# Patient Record
Sex: Female | Born: 1960 | Race: White | Hispanic: No | Smoking: Never smoker
Health system: Southern US, Community
[De-identification: ages and names within clinical notes are randomized; demographics above are authoritative.]

## PROBLEM LIST (undated history)

## (undated) DIAGNOSIS — I10 Essential (primary) hypertension: Secondary | ICD-10-CM

## (undated) DIAGNOSIS — Z8679 Personal history of other diseases of the circulatory system: Secondary | ICD-10-CM

## (undated) DIAGNOSIS — Z951 Presence of aortocoronary bypass graft: Secondary | ICD-10-CM

## (undated) DIAGNOSIS — E785 Hyperlipidemia, unspecified: Secondary | ICD-10-CM

## (undated) DIAGNOSIS — F419 Anxiety disorder, unspecified: Secondary | ICD-10-CM

## (undated) DIAGNOSIS — M1990 Unspecified osteoarthritis, unspecified site: Secondary | ICD-10-CM

## (undated) DIAGNOSIS — F32A Depression, unspecified: Secondary | ICD-10-CM

## (undated) DIAGNOSIS — G4730 Sleep apnea, unspecified: Secondary | ICD-10-CM

## (undated) DIAGNOSIS — F329 Major depressive disorder, single episode, unspecified: Secondary | ICD-10-CM

## (undated) DIAGNOSIS — I219 Acute myocardial infarction, unspecified: Secondary | ICD-10-CM

## (undated) DIAGNOSIS — I509 Heart failure, unspecified: Secondary | ICD-10-CM

## (undated) DIAGNOSIS — J45909 Unspecified asthma, uncomplicated: Secondary | ICD-10-CM

## (undated) DIAGNOSIS — K219 Gastro-esophageal reflux disease without esophagitis: Secondary | ICD-10-CM

## (undated) DIAGNOSIS — I2510 Atherosclerotic heart disease of native coronary artery without angina pectoris: Secondary | ICD-10-CM

## (undated) DIAGNOSIS — T7840XA Allergy, unspecified, initial encounter: Secondary | ICD-10-CM

## (undated) DIAGNOSIS — D649 Anemia, unspecified: Secondary | ICD-10-CM

## (undated) HISTORY — DX: Hyperlipidemia, unspecified: E78.5

## (undated) HISTORY — DX: Sleep apnea, unspecified: G47.30

## (undated) HISTORY — DX: Atherosclerotic heart disease of native coronary artery without angina pectoris: I25.10

## (undated) HISTORY — DX: Essential (primary) hypertension: I10

## (undated) HISTORY — DX: Anemia, unspecified: D64.9

## (undated) HISTORY — DX: Unspecified asthma, uncomplicated: J45.909

## (undated) HISTORY — PX: FRACTURE SURGERY: SHX138

## (undated) HISTORY — DX: Allergy, unspecified, initial encounter: T78.40XA

## (undated) HISTORY — DX: Major depressive disorder, single episode, unspecified: F32.9

## (undated) HISTORY — DX: Acute myocardial infarction, unspecified: I21.9

## (undated) HISTORY — DX: Gastro-esophageal reflux disease without esophagitis: K21.9

## (undated) HISTORY — PX: CORONARY ARTERY BYPASS GRAFT: SHX141

## (undated) HISTORY — DX: Unspecified osteoarthritis, unspecified site: M19.90

## (undated) HISTORY — DX: Heart failure, unspecified: I50.9

## (undated) HISTORY — DX: Personal history of other diseases of the circulatory system: Z86.79

## (undated) HISTORY — DX: Depression, unspecified: F32.A

## (undated) HISTORY — DX: Presence of aortocoronary bypass graft: Z95.1

## (undated) HISTORY — DX: Anxiety disorder, unspecified: F41.9

---

## 1998-02-28 ENCOUNTER — Ambulatory Visit (HOSPITAL_COMMUNITY): Admission: RE | Admit: 1998-02-28 | Discharge: 1998-02-28 | Payer: Self-pay | Admitting: Cardiology

## 2002-06-22 ENCOUNTER — Encounter: Payer: Self-pay | Admitting: Emergency Medicine

## 2002-06-22 ENCOUNTER — Emergency Department (HOSPITAL_COMMUNITY): Admission: EM | Admit: 2002-06-22 | Discharge: 2002-06-23 | Payer: Self-pay | Admitting: Emergency Medicine

## 2002-06-23 ENCOUNTER — Encounter: Payer: Self-pay | Admitting: Emergency Medicine

## 2008-04-19 ENCOUNTER — Inpatient Hospital Stay (HOSPITAL_COMMUNITY): Admission: EM | Admit: 2008-04-19 | Discharge: 2008-04-24 | Payer: Self-pay | Admitting: Emergency Medicine

## 2008-05-12 ENCOUNTER — Encounter: Payer: Self-pay | Admitting: Internal Medicine

## 2008-06-07 ENCOUNTER — Encounter: Payer: Self-pay | Admitting: Internal Medicine

## 2008-07-07 ENCOUNTER — Encounter: Payer: Self-pay | Admitting: Internal Medicine

## 2008-08-07 ENCOUNTER — Encounter: Payer: Self-pay | Admitting: Internal Medicine

## 2008-09-06 ENCOUNTER — Encounter: Payer: Self-pay | Admitting: Internal Medicine

## 2009-11-28 ENCOUNTER — Ambulatory Visit: Payer: Self-pay | Admitting: Cardiology

## 2009-11-28 ENCOUNTER — Encounter: Payer: Self-pay | Admitting: Cardiology

## 2009-11-29 ENCOUNTER — Encounter: Payer: Self-pay | Admitting: Cardiology

## 2009-12-01 ENCOUNTER — Encounter: Payer: Self-pay | Admitting: Cardiology

## 2009-12-01 DIAGNOSIS — G4733 Obstructive sleep apnea (adult) (pediatric): Secondary | ICD-10-CM

## 2009-12-12 ENCOUNTER — Encounter: Payer: Self-pay | Admitting: Cardiology

## 2009-12-26 ENCOUNTER — Encounter: Payer: Self-pay | Admitting: Cardiology

## 2009-12-26 DIAGNOSIS — E669 Obesity, unspecified: Secondary | ICD-10-CM

## 2009-12-26 DIAGNOSIS — M329 Systemic lupus erythematosus, unspecified: Secondary | ICD-10-CM

## 2009-12-26 DIAGNOSIS — R072 Precordial pain: Secondary | ICD-10-CM

## 2009-12-26 DIAGNOSIS — I2510 Atherosclerotic heart disease of native coronary artery without angina pectoris: Secondary | ICD-10-CM

## 2009-12-26 DIAGNOSIS — Z951 Presence of aortocoronary bypass graft: Secondary | ICD-10-CM | POA: Insufficient documentation

## 2009-12-26 DIAGNOSIS — Z9861 Coronary angioplasty status: Secondary | ICD-10-CM

## 2009-12-26 DIAGNOSIS — I209 Angina pectoris, unspecified: Secondary | ICD-10-CM

## 2009-12-26 DIAGNOSIS — K219 Gastro-esophageal reflux disease without esophagitis: Secondary | ICD-10-CM | POA: Insufficient documentation

## 2010-11-06 NOTE — Letter (Signed)
Summary: Appointment -missed  [facility] at [facility] [address]   [place], [place] [address]   Phone: [phone]  Fax: [phone]     December 26, 2009 MRN: [id number]      [name] [address] [place], [place]  [address]      Dear Ms. [facility],  Our records indicate you missed your appointment on December 26, 2009                        with Dr. Andee Lineman.   It is very important that we reach you to reschedule this appointment. We look forward to participating in your health care needs.   Please contact us at the number listed above at your earliest convenience to reschedule this appointment.   Sincerely,    Glass blower/designer

## 2010-11-06 NOTE — Letter (Signed)
Summary: [facility] H& P/ D& C DR. Fayrene Fearing [facility]  [facility] D& C DR. Fayrene Fearing [facility]   Imported By: Zachary George 02/05/2010 11:45:57  _____________________________________________________________________  External Attachment:    Type:   Image     Comment:   External Document

## 2010-11-06 NOTE — Letter (Signed)
Summary: [facility] Order for Home Sleep Testing  [facility] Order for Home Sleep Testing   Imported By: Cyril Loosen, RN, BSN 12/01/2009 15:53:09  _____________________________________________________________________  External Attachment:    Type:   Image     Comment:   External Document  Appended Document: Orders Update    Clinical Lists Changes  Problems: Added new problem of OBSTRUCTIVE SLEEP APNEA (ICD-327.23) Orders: Added new Referral order of Home Health Referral [facility]) - Signed

## 2010-11-06 NOTE — Consult Note (Signed)
Summary: CARDIOLOGY CONSULT/ [facility]  CARDIOLOGY CONSULT/ [facility]   Imported By: Zachary George 02/05/2010 11:45:19  _____________________________________________________________________  External Attachment:    Type:   Image     Comment:   External Document
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
## 2011-02-19 NOTE — Op Note (Signed)
NAMEZAELA, GRALEY             ACCOUNT NO.:  [id number]   MEDICAL RECORD NO.:  [id number]          PATIENT TYPE:  INP   LOCATION:  1830                         FACILITY:  [facility]   PHYSICIAN:  Jene Every, M.D.    DATE OF BIRTH:  1960-10-29   DATE OF PROCEDURE:  04/20/2008  DATE OF DISCHARGE:                               OPERATIVE REPORT   PREOPERATIVE DIAGNOSIS:  Fracture-dislocation of the talus with a  pantalar dislocation.   POSTOPERATIVE DIAGNOSIS:  Fracture-dislocation of the talus with a  pantalar dislocation.   PROCEDURE PERFORMED:  Attempted closed reduction of the right talus  followed by open reduction and internal fixation of the talus with a  lateral ligamentous reconstruction with pinning of the subtalar joint  and the talonavicular joint.   SURGEON:  Jene Every, MD   ANESTHESIA:  General.   ASSISTANT:  Thayer Headings, M.D.   INDICATIONS:  A 50 year old with fracture-dislocation of the ankle with  pantalar dislocation, unable to reduce at the emergency room.  She was  indicated possible open reduction and internal fixation.  Risks and  benefits discussed with her including bleeding, infection, damage to  surrounding structures, need for revision, avascular necrosis of the  talus, need for subsequent fusion, DVT, PE, anesthetic complications,  etc.   TECHNIQUE:  The patient was placed in supine position.  After induction  of adequate anesthesia with 1 g vancomycin, right lower extremity  prepped, draped, and exsanguinated in the usual sterile fashion after  attempted repeat closed reduction was unsuccessful.  I made incision  over lateral aspect of the ankle, an anterolateral incision.  Based it  from near the syndesmosis curving towards the base of the fourth and  fifth metatarsals.  Subcutaneous tissue was bluntly dissected.  She had  very tenuous skin.  There was not an open fracture.  I bluntly dissected  through that.  There was extensive soft  tissue damage that was noted.  Fracture hematoma was expressed.  Wound copiously irrigated.  The talus  was noted to be extruded.  Had very minimal soft tissue attachment to  this.  It was rotated in 2 planes.  This was then reconfigured and  reduced with the aid of longitudinal traction, internal rotation with  reduction of the talonavicular as well as the tibiotalar joint as well  as the subtalar joint wounds copiously irrigated.  The lateral  ligamentous complex was obviously devoid.  We then repaired the lateral  capsule with 0 Vicryl interrupted figure-of-eight sutures.  We also  repaired the talofibular ligament as well as extensor retinaculum.  Following this, there was some provisional stabilization, we felt  supplemental pinning would be appropriate given the extent of the  dislocation.  I then pinned the tibiotalar joint with a 625 K-wire from  the navicular medially into the talus and the AP and lateral planes  found to be satisfactory.  We then placed a pin across the calcaneal  subtalar joint.  We advanced through the open wound across the talus to  the calcaneus and out through the posterior aspect of the calcaneus and  then recessed  it within the talus, it was helped to stabilize the  subtalar joint.  The redundant pin was then cut, bent, and pin covers  were placed.  These were then wrapped with Adaptic and 4x4s, and the AP  and lateral planes found to be satisfactory and stable.  We then  irrigated the wound and closed the subcutaneous tissue with 2-0 Vicryl  simple sutures.  Skin was reapproximated with staples.  Tourniquet was  deflated and adequate vascularization of lower extremity appreciated.  The dorsalis pedis and posterior tibial pulses appreciated.  We placed  in a short-leg cast, molded with dorsiflexion.  The patient tolerated  the procedure well.  There were no complications, a well-padded cast.  She was then extubated without difficulty and transported to  recovery  room in satisfactory condition.  The patient tolerated the procedure  well with no complications.  The joint was irrigated and debrided. No  fragments seen. Minor  cartilage abrasion on talus. No fracture of talus noted.      Jene Every, M.D.  Electronically Signed     JB/MEDQ  D:  04/20/2008  T:  04/20/2008  Job:  [id number]
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
## 2011-07-04 LAB — POCT I-STAT, CHEM 8
BUN: 10
Chloride: 102
Creatinine, Ser: 0.8
Potassium: 3 — ABNORMAL LOW
Sodium: 137

## 2011-07-04 LAB — CBC
Hemoglobin: 13.7
MCHC: 33.6
RBC: 4.66
WBC: 10.2

## 2011-07-04 LAB — POCT CARDIAC MARKERS
CKMB, poc: <1 — ABNORMAL LOW
Operator id: 146091
Troponin i, poc: <0.05

## 2011-07-04 LAB — DIFFERENTIAL
Basophils Relative: 0
Eosinophils Relative: 1
Lymphocytes Relative: 11 — ABNORMAL LOW
Monocytes Relative: 6
Neutro Abs: 8.4 — ABNORMAL HIGH
Neutrophils Relative %: 82 — ABNORMAL HIGH
Smear Review: DECREASED

## 2011-07-04 LAB — SAMPLE TO BLOOD BANK

## 2011-07-05 LAB — URINE CULTURE: Special Requests: NEGATIVE

## 2011-07-05 LAB — CBC
HCT: 35.7 — ABNORMAL LOW
MCHC: 33.8
MCV: 86.6
MCV: 87.3
Platelets: 209
Platelets: 233
RBC: 4.05
RDW: 13.3
WBC: 7
WBC: 7.4

## 2011-07-05 LAB — POTASSIUM: Potassium: 3.2 — ABNORMAL LOW

## 2011-07-05 LAB — URINALYSIS, ROUTINE W REFLEX MICROSCOPIC
Hgb urine dipstick: NEGATIVE
Specific Gravity, Urine: 1.023
pH: 6

## 2011-07-05 LAB — BASIC METABOLIC PANEL
BUN: 6
Creatinine, Ser: 0.66
GFR calc non Af Amer: >60
Glucose, Bld: 180 — ABNORMAL HIGH
Potassium: 2.9 — ABNORMAL LOW

## 2011-07-05 LAB — URINE MICROSCOPIC-ADD ON

## 2017-01-16 ENCOUNTER — Ambulatory Visit: Payer: Self-pay | Admitting: Family Medicine

## 2017-01-30 ENCOUNTER — Encounter: Payer: Self-pay | Admitting: Family Medicine

## 2017-01-30 ENCOUNTER — Ambulatory Visit (INDEPENDENT_AMBULATORY_CARE_PROVIDER_SITE_OTHER): Payer: Medicaid Other | Admitting: Family Medicine

## 2017-01-30 VITALS — BP 128/80 | HR 72 | Temp 97.4°F | Resp 16 | Ht 66.0 in | Wt 192.1 lb

## 2017-01-30 DIAGNOSIS — K0510 Chronic gingivitis, plaque induced: Secondary | ICD-10-CM | POA: Diagnosis not present

## 2017-01-30 DIAGNOSIS — Z7689 Persons encountering health services in other specified circumstances: Secondary | ICD-10-CM | POA: Diagnosis not present

## 2017-01-30 DIAGNOSIS — E7801 Familial hypercholesterolemia: Secondary | ICD-10-CM | POA: Diagnosis not present

## 2017-01-30 DIAGNOSIS — K130 Diseases of lips: Secondary | ICD-10-CM

## 2017-01-30 DIAGNOSIS — J3089 Other allergic rhinitis: Secondary | ICD-10-CM | POA: Diagnosis not present

## 2017-01-30 DIAGNOSIS — I10 Essential (primary) hypertension: Secondary | ICD-10-CM | POA: Diagnosis not present

## 2017-01-30 DIAGNOSIS — G8921 Chronic pain due to trauma: Secondary | ICD-10-CM | POA: Insufficient documentation

## 2017-01-30 DIAGNOSIS — Z8619 Personal history of other infectious and parasitic diseases: Secondary | ICD-10-CM | POA: Insufficient documentation

## 2017-01-30 DIAGNOSIS — E559 Vitamin D deficiency, unspecified: Secondary | ICD-10-CM | POA: Diagnosis not present

## 2017-01-30 DIAGNOSIS — Z91018 Allergy to other foods: Secondary | ICD-10-CM

## 2017-01-30 DIAGNOSIS — I2510 Atherosclerotic heart disease of native coronary artery without angina pectoris: Secondary | ICD-10-CM | POA: Diagnosis not present

## 2017-01-30 DIAGNOSIS — E785 Hyperlipidemia, unspecified: Secondary | ICD-10-CM | POA: Insufficient documentation

## 2017-01-30 MED ORDER — CEPHALEXIN 500 MG PO CAPS: 1000.0000 mg | ORAL_CAPSULE | Freq: Two times a day (BID) | ORAL | 0 refills | Status: DC

## 2017-01-30 NOTE — Patient Instructions (Addendum)
Need old records Dr Charm Barges  Need colon cancer screening- will arrange next visit  Mammogram every 2 years  Need to see a cardiologist  Lab tests today  See me in a month

## 2017-01-30 NOTE — Progress Notes (Signed)
Chief Complaint  Patient presents with  . Establish Care   Complicated patient here for her first visit. Limited old records are available. She had coronary artery disease is very young age, had stents in her early 85s and then coronary artery bypass grafting at the age of 63. She states she feels well. She is not under the care of a cardiologist. She is not taking any medications for her heart except for 81 mg of aspirin a day. She states she is intolerant of statins. She cannot name which statin she was on or what reaction she had. She states she was on a beta blocker but it made her blood pressure "too low". I explained to her that I believe she needs to be on medication to prevent future coronary artery disease, I would like to check her lipids today, see about a new statin, and refer her to a cardiologist for care. She is likely due for cardiac testing to check the status of her grafts. She has a history of hypertension but is not on hypertension medication. She states she has lupus. She is not under a doctor's care for this. It looks that she previously took Plaquenil. She has chronic pain in her ankle and her "joint". She sees a pain specialist. She is on OxyContin 30 mg twice a day. She understands that I do not do chronic pain management. She denies constipation or side effects from her pain pills. She states she is on Keflex for chronic gingivitis and gum disease. She states her doctor thought this was necessary because of her heart condition. She takes Ditropan for sweats, unclear indication. She insists this is not for any bladder control issue. She has GERD. This is controlled with AcipHex and Zofran. She is obese 192 pounds. She is happy to be under 200 pounds, telling me she recently weight 230 pounds. Diet and exercise is still reviewed with her. She is not up-to-date with health maintenance. She states she had a Pap 2 years ago. She states she had a mammogram 2 years ago. She's  never had a colonoscopy. She thinks she had a pneumonia shot years ago. Old records will be requested. Patient Active Problem List   Diagnosis Date Noted  . Food allergy 01/30/2017  . HLD (hyperlipidemia) 01/30/2017  . History of hepatitis 01/30/2017  . Essential hypertension 01/30/2017  . Chronic pain due to injury 01/30/2017  . Gingivitis 01/30/2017  . OBESITY, UNSPECIFIED 12/26/2009  . CAD 12/26/2009  . GERD 12/26/2009  . SYSTEMIC LUPUS ERYTHEMATOSUS 12/26/2009  . 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 OBSTRUCTIVE SLEEP APNEA 12/01/2009    Outpatient Encounter Prescriptions as of 01/30/2017  Medication Sig  . aspirin EC 81 MG tablet Take 81 mg by mouth daily.  . cephALEXin (KEFLEX) 500 MG capsule Take 2 capsules (1,000 mg total) by mouth 2 (two) times daily.  Marland Kitchen oxybutynin (DITROPAN-XL) 10 MG 24 hr tablet Take 20 mg by mouth 2 (two) times daily.   Marland Kitchen oxyCODONE 30 MG 12 hr tablet Take by mouth.  . Vitamin D, Ergocalciferol, (DRISDOL) 50000 units CAPS capsule Take 100,000 Units by mouth every 7 (seven) days.  . ondansetron (ZOFRAN-ODT) 4 MG disintegrating tablet Take by mouth.  . RABEprazole (ACIPHEX) 20 MG tablet Take by mouth.   No facility-administered encounter medications on file as of 01/30/2017.     Past Medical History:  Diagnosis Date  . Allergy    seasonal  . Anemia   . Anxiety   .  Arthritis    osteo  . Asthma   . CAD (coronary artery disease)   . CHF (congestive heart failure) (HCC)   . Depression   . GERD (gastroesophageal reflux disease)   . History of CHF (congestive heart failure)   . Hx of CABG   . Hyperlipidemia   . Hypertension   . Myocardial infarction (HCC)    since 30's  . Sleep apnea    not using CPAP    Past Surgical History:  Procedure Laterality Date  . CORONARY ARTERY BYPASS GRAFT    . FRACTURE SURGERY     right ankle    Social History   Social History  . Marital status: Divorced    Spouse name: N/A  . Number of  children: 3  . Years of education: 31   Occupational History  . disabled     heart and lupus and leg  .      Teacher - middle school   Social History Main Topics  . Smoking status: Never Smoker  . Smokeless tobacco: Never Used  . Alcohol use Yes  . Drug use: No  . Sexual activity: Not on file   Other Topics Concern  . Not on file   Social History Narrative   Disabled   Schoolteacher   Lives with middle daughter , Lawson Fiscal - age 11 who is intellectually delayed    Family History  Problem Relation Age of Onset  . Heart disease Mother 40    sudden death   . Asthma Mother   . Hypertension Mother   . Heart disease Father   . Arthritis Father   . COPD Father   . Hyperlipidemia Father   . Alzheimer's disease Father   . Heart disease Sister   . Heart disease Paternal Grandmother 35    Review of Systems  Constitutional: Positive for malaise/fatigue. Negative for chills, fever and weight loss.  HENT: Negative for congestion and hearing loss.   Eyes: Negative for blurred vision and pain.  Respiratory: Positive for shortness of breath. Negative for cough.   Cardiovascular: Negative for chest pain and leg swelling.  Gastrointestinal: Negative for abdominal pain, constipation, diarrhea and heartburn.  Genitourinary: Negative for dysuria and frequency.  Musculoskeletal: Positive for back pain and joint pain. Negative for falls and myalgias.  Neurological: Negative for dizziness, seizures and headaches.  Psychiatric/Behavioral: Negative for depression. The patient is not nervous/anxious and does not have insomnia.        Stress at home    BP 128/80 (BP Location: Right Arm, Patient Position: Sitting, Cuff Size: Normal)   Pulse 72   Temp 97.4 F (36.3 C) (Temporal)   Resp 16   Ht  (1.676 m)   Wt 192 lb 1.3 oz (87.1 kg)   SpO2 98%   BMI 31.00 kg/m   Physical Exam  Constitutional: She is oriented to person, place, and time. She appears well-developed and well-nourished.   HENT:  Head: Normocephalic and atraumatic.  Right Ear: External ear normal.  Left Ear: External ear normal.  Mouth/Throat: Oropharynx is clear and moist.  Cracks at corner of mouth  Eyes: Conjunctivae are normal. Pupils are equal, round, and reactive to light.  Neck: Normal range of motion. Neck supple. No thyromegaly present.  Cardiovascular: Normal rate, regular rhythm and normal heart sounds.   Well-healed sternotomy scar. Occasional ectopy  Pulmonary/Chest: Effort normal and breath sounds normal. No respiratory distress.  Abdominal: Soft. Bowel sounds are normal.  Musculoskeletal: Normal range  of motion. She exhibits no edema.  Left ankle with scarring and swelling  Lymphadenopathy:    She has no cervical adenopathy.  Neurological: She is alert and oriented to person, place, and time.  Gait normal  Skin: Skin is warm and dry.  Pale  Psychiatric: She has a normal mood and affect. Her behavior is normal. Thought content normal.  Nursing note and vitals reviewed.   1. Food allergy Current  2. Familial hypercholesterolemia Untreated  3. History of hepatitis Unclear type  4. Essential hypertension Not on medication  5. Chronic pain due to injury Seen pain clinic  6. Encounter to establish care with new doctor Old records requested  7. Environmental and seasonal allergies   8. Cheilosis Noted on exam - Vitamin B12  9. Vitamin D deficiency  - VITAMIN D 25 Hydroxy (Vit-D Deficiency, Fractures)  10. Coronary artery disease involving native heart without angina pectoris, unspecified vessel or lesion type  - CBC - COMPLETE METABOLIC PANEL WITH GFR - Lipid panel - Urinalysis, Routine w reflex microscopic - Ambulatory referral to Cardiology  11. Gingivitis On chronic Keflex for suppression of infection   Patient Instructions  Need old records Dr Charm Barges  Need colon cancer screening- will arrange next visit  Mammogram every 2 years  Need to see a  cardiologist  Lab tests today  See me in a month     Eustace Moore, MD

## 2017-01-31 ENCOUNTER — Encounter: Payer: Self-pay | Admitting: Family Medicine

## 2017-01-31 DIAGNOSIS — T452X1A Poisoning by vitamins, accidental (unintentional), initial encounter: Secondary | ICD-10-CM

## 2017-01-31 LAB — COMPLETE METABOLIC PANEL WITH GFR
AG RATIO: 2.2 ratio (ref 1.0–2.5)
ALBUMIN: 4.7 g/dL (ref 3.6–5.1)
ALK PHOS: 51 U/L (ref 33–130)
ALT: 15 U/L (ref 6–29)
AST: 22 U/L (ref 10–35)
BILIRUBIN TOTAL: 0.4 mg/dL (ref 0.2–1.2)
BUN / CREAT RATIO: 16.7 ratio (ref 6–22)
BUN: 13 mg/dL (ref 7–25)
CALCIUM: 9.7 mg/dL (ref 8.6–10.4)
CO2: 22 mmol/L (ref 20–31)
CREATININE: 0.78 mg/dL (ref 0.50–1.05)
Chloride: 104 mmol/L (ref 98–110)
GFR, Est Non African American: 86 mL/min (ref >=60)
Globulin: 2.1 g/dL (ref 1.9–3.7)
Glucose, Bld: 86 mg/dL (ref 65–99)
Potassium: 4 mmol/L (ref 3.5–5.3)
Sodium: 141 mmol/L (ref 135–146)
Total Protein: 6.8 g/dL (ref 6.1–8.1)

## 2017-01-31 LAB — URINALYSIS, ROUTINE W REFLEX MICROSCOPIC
Bilirubin Urine: NEGATIVE
Glucose, UA: NEGATIVE
Hgb urine dipstick: NEGATIVE
KETONES UR: NEGATIVE
LEUKOCYTES UA: NEGATIVE
NITRITE: NEGATIVE
Protein, ur: NEGATIVE
SPECIFIC GRAVITY, URINE: 1.011 (ref 1.001–1.035)
pH: 6.5 (ref 5.0–8.0)

## 2017-01-31 LAB — CBC
HEMATOCRIT: 37.1 % (ref 35.0–45.0)
HEMOGLOBIN: 12.3 g/dL (ref 11.7–15.5)
MCH: 29.1 pg (ref 27.0–33.0)
MCHC: 33.2 g/dL (ref 32.0–36.0)
MCV: 87.9 fL (ref 80.0–100.0)
MPV: 9.6 fL (ref 7.5–12.5)
Platelets: 249 10*3/uL (ref 140–400)
RBC: 4.22 MIL/uL (ref 3.80–5.10)
RDW: 14 % (ref 11.0–15.0)
WBC: 5.8 10*3/uL (ref 3.8–10.8)

## 2017-01-31 LAB — LIPID PANEL
CHOLESTEROL: 209 mg/dL — AB (ref <200)
HDL: 51 mg/dL (ref >50)
LDL Cholesterol: 143 mg/dL — ABNORMAL HIGH (ref <100)
Total CHOL/HDL Ratio: 4.1 Ratio (ref <5.0)
Triglycerides: 77 mg/dL (ref <150)
VLDL: 15 mg/dL (ref <30)

## 2017-01-31 LAB — VITAMIN D 25 HYDROXY (VIT D DEFICIENCY, FRACTURES): Vit D, 25-Hydroxy: >150 ng/mL — ABNORMAL HIGH (ref 30–100)

## 2017-01-31 LAB — VITAMIN B12: VITAMIN B 12: 394 pg/mL (ref 200–1100)

## 2017-02-03 ENCOUNTER — Telehealth: Payer: Self-pay

## 2017-02-03 NOTE — Telephone Encounter (Signed)
error 

## 2017-02-11 ENCOUNTER — Encounter: Payer: Self-pay | Admitting: Cardiology

## 2017-02-20 ENCOUNTER — Encounter: Payer: Self-pay | Admitting: Cardiology

## 2017-03-05 ENCOUNTER — Other Ambulatory Visit: Payer: Self-pay | Admitting: Family Medicine

## 2017-03-06 ENCOUNTER — Ambulatory Visit: Payer: Self-pay | Admitting: Family Medicine

## 2017-03-10 ENCOUNTER — Ambulatory Visit (INDEPENDENT_AMBULATORY_CARE_PROVIDER_SITE_OTHER): Payer: Medicaid Other | Admitting: Family Medicine

## 2017-03-10 ENCOUNTER — Encounter: Payer: Self-pay | Admitting: Family Medicine

## 2017-03-10 VITALS — BP 160/90 | HR 60 | Temp 97.8°F | Resp 18 | Ht 66.0 in | Wt 193.1 lb

## 2017-03-10 DIAGNOSIS — F4323 Adjustment disorder with mixed anxiety and depressed mood: Secondary | ICD-10-CM | POA: Diagnosis not present

## 2017-03-10 DIAGNOSIS — K0510 Chronic gingivitis, plaque induced: Secondary | ICD-10-CM

## 2017-03-10 DIAGNOSIS — E7801 Familial hypercholesterolemia: Secondary | ICD-10-CM

## 2017-03-10 DIAGNOSIS — I10 Essential (primary) hypertension: Secondary | ICD-10-CM | POA: Diagnosis not present

## 2017-03-10 DIAGNOSIS — I2510 Atherosclerotic heart disease of native coronary artery without angina pectoris: Secondary | ICD-10-CM

## 2017-03-10 MED ORDER — ROSUVASTATIN CALCIUM 10 MG PO TABS: 10.0000 mg | ORAL_TABLET | Freq: Every day | ORAL | 3 refills | Status: AC

## 2017-03-10 MED ORDER — CEPHALEXIN 500 MG PO CAPS: 1000.0000 mg | ORAL_CAPSULE | Freq: Two times a day (BID) | ORAL | 0 refills | Status: DC

## 2017-03-10 MED ORDER — CARVEDILOL 3.125 MG PO TABS: 3.1250 mg | ORAL_TABLET | Freq: Two times a day (BID) | ORAL | 3 refills | Status: AC

## 2017-03-10 NOTE — Patient Instructions (Addendum)
Take the keflex as directed  Take the rosuvastatin (crestor) 10 mg EVERY OTHER DAY If you tolerate this, take it every day  Take the coreg twice a day This is for blood pressure It will slow your heart rate   These two medicines help prevent future heart attack

## 2017-03-10 NOTE — Progress Notes (Signed)
Chief Complaint  Patient presents with  . Follow-up    1 month   Routine follow up Under stress at home Reviewed recent lab tests Results for [name], [name] (MRN [id number]) as of 03/10/2017 16:35  Ref. Range 01/30/2017 15:40  Total CHOL/HDL Ratio Latest Ref Range: <5.0 Ratio 4.1  Cholesterol Latest Ref Range: <200 mg/dL 409209 (H)  HDL Cholesterol Latest Ref Range: >50 mg/dL 51  LDL (calc) Latest Ref Range: <100 mg/dL 811143 (H)  Triglycerides Latest Ref Range: <150 mg/dL 77  VLDL Latest Ref Range: <30 mg/dL 15   Lipids are too high for a heart patient and I recommend a statin.  Previously didn't tolerate lipitor.  Will try low dose crestor. Has high BP.  Says that toprol made her BP too low.  Agrees to try low dose coreg Has not yet gone to a cardiologist.  Feels she needs time for insurance approval No exercise Chronic gingivitis.  Prior doc kept her on keflex.  I will give one month supply until she can see dentist Was vitamin D toxic.  Told to stop supplement, will repeat labs in a couple of months  Patient Active Problem List   Diagnosis Date Noted  . Food allergy 01/30/2017  . HLD (hyperlipidemia) 01/30/2017  . History of hepatitis 01/30/2017  . Essential hypertension 01/30/2017  . Chronic pain due to injury 01/30/2017  . Gingivitis 01/30/2017  . OBESITY, UNSPECIFIED 12/26/2009  . CAD 12/26/2009  . GERD 12/26/2009  . SYSTEMIC LUPUS ERYTHEMATOSUS 12/26/2009  . 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 OBSTRUCTIVE SLEEP APNEA 12/01/2009    Outpatient Encounter Prescriptions as of 03/10/2017  Medication Sig  . aspirin EC 81 MG tablet Take 81 mg by mouth daily.  . cephALEXin (KEFLEX) 500 MG capsule Take 2 capsules (1,000 mg total) by mouth 2 (two) times daily.  . ondansetron (ZOFRAN-ODT) 4 MG disintegrating tablet Take by mouth.  . oxybutynin (DITROPAN-XL) 10 MG 24 hr tablet Take 20 mg by mouth 2 (two) times daily.   . RABEprazole (ACIPHEX) 20 MG tablet Take  by mouth.  . Vitamin D, Ergocalciferol, (DRISDOL) 50000 units CAPS capsule Take 100,000 Units by mouth every 7 (seven) days.  . carvedilol (COREG) 3.125 MG tablet Take 1 tablet (3.125 mg total) by mouth 2 (two) times daily with a meal.  . rosuvastatin (CRESTOR) 10 MG tablet Take 1 tablet (10 mg total) by mouth daily.   No facility-administered encounter medications on file as of 03/10/2017.     Allergies  Allergen Reactions  . Sulfa Antibiotics Hives    Rash and itching    Review of Systems  Constitutional: Negative for fatigue and unexpected weight change.  HENT: Positive for dental problem and mouth sores.   Eyes: Negative for photophobia and visual disturbance.  Respiratory: Negative for shortness of breath and wheezing.   Cardiovascular: Positive for leg swelling. Negative for chest pain and palpitations.  Gastrointestinal: Negative for constipation, nausea and vomiting.  Genitourinary: Negative for difficulty urinating and vaginal bleeding.  Musculoskeletal: Negative for arthralgias and back pain.  Neurological: Negative for dizziness and headaches.  Psychiatric/Behavioral: Positive for decreased concentration, dysphoric mood and sleep disturbance. The patient is nervous/anxious.        Disabled daughter at home    BP (!) 160/90   Pulse 60   Temp 97.8 F (36.6 C) (Temporal)   Resp 18   Ht 5\' 6"  (1.676 m)   Wt 193 lb 1.9 oz (87.6 kg)   SpO2 98%  BMI 31.17 kg/m   Physical Exam  Constitutional: She is oriented to person, place, and time. She appears well-developed and well-nourished.  HENT:  Head: Normocephalic and atraumatic.  Mouth/Throat: Oropharynx is clear and moist.  Cracks at corner of mouth, teeth need repair, caries  Eyes: Conjunctivae are normal. Pupils are equal, round, and reactive to light.  Neck: Normal range of motion. Neck supple.  Cardiovascular: Normal rate, regular rhythm and normal heart sounds.   Well-healed sternotomy scar. Occasional ectopy    Pulmonary/Chest: Effort normal and breath sounds normal. No respiratory distress.  Musculoskeletal: Normal range of motion. She exhibits no edema.  Left ankle with scarring and swelling  Lymphadenopathy:    She has no cervical adenopathy.  Neurological: She is alert and oriented to person, place, and time.  Gait normal  Skin: Skin is warm and dry.  Pale  Psychiatric: Her behavior is normal. Thought content normal.  Labile, pressured  Nursing note and vitals reviewed.   ASSESSMENT/PLAN:  1. Gingivitis Refill keflex - to dentist  2. Familial hypercholesterolemia crestor  3. Coronary artery disease involving native heart without angina pectoris, unspecified vessel or lesion type cardiology  4. Essential hypertension Start coreg  5. Situational mixed anxiety and depressive disorder Offered referral for counsleing   Patient Instructions  Take the keflex as directed  Take the rosuvastatin (crestor) 10 mg EVERY OTHER DAY If you tolerate this, take it every day  Take the coreg twice a day This is for blood pressure It will slow your heart rate   These two medicines help prevent future heart attack       Eustace Moore, MD

## 2017-03-21 ENCOUNTER — Other Ambulatory Visit: Payer: Self-pay | Admitting: Family Medicine

## 2017-04-11 ENCOUNTER — Encounter: Payer: Self-pay | Admitting: Family Medicine

## 2017-04-11 ENCOUNTER — Ambulatory Visit (INDEPENDENT_AMBULATORY_CARE_PROVIDER_SITE_OTHER): Payer: Medicaid Other | Admitting: Family Medicine

## 2017-04-11 VITALS — BP 138/70 | HR 62 | Temp 97.0°F | Resp 16 | Ht 66.0 in | Wt 195.0 lb

## 2017-04-11 DIAGNOSIS — E559 Vitamin D deficiency, unspecified: Secondary | ICD-10-CM

## 2017-04-11 DIAGNOSIS — I2510 Atherosclerotic heart disease of native coronary artery without angina pectoris: Secondary | ICD-10-CM | POA: Diagnosis not present

## 2017-04-11 DIAGNOSIS — K219 Gastro-esophageal reflux disease without esophagitis: Secondary | ICD-10-CM

## 2017-04-11 DIAGNOSIS — G479 Sleep disorder, unspecified: Secondary | ICD-10-CM

## 2017-04-11 DIAGNOSIS — E785 Hyperlipidemia, unspecified: Secondary | ICD-10-CM

## 2017-04-11 DIAGNOSIS — M329 Systemic lupus erythematosus, unspecified: Secondary | ICD-10-CM | POA: Diagnosis not present

## 2017-04-11 MED ORDER — NITROGLYCERIN 0.4 MG SL SUBL: 0.4000 mg | SUBLINGUAL_TABLET | SUBLINGUAL | 3 refills | Status: AC | PRN

## 2017-04-11 MED ORDER — NAPROXEN 500 MG PO TABS: 500.0000 mg | ORAL_TABLET | Freq: Two times a day (BID) | ORAL | 6 refills | Status: AC

## 2017-04-11 NOTE — Progress Notes (Signed)
Chief Complaint  Patient presents with  . Follow-up    1 month  still has not seen cardiology New referral needs to be placed Complains of increased stress due to mentally ill daughter Not sleeping well Wakes up "fast heart and gasping for breath".  Does not know if it is anxiety .   Poor exercise tolerance On chronic pain medicine Is tired during day.  Requests provigil.  This is denied, and explained that I feel it unsafe with her heart condition.  Has untreated sleep apnea.  Has poor sleep.   Has old Rx for naprosyn she takes as needed arthritis pain.  Is advised to use sparingly and with food due to GI issues   Patient Active Problem List   Diagnosis Date Noted  . Food allergy 01/30/2017  . HLD (hyperlipidemia) 01/30/2017  . History of hepatitis 01/30/2017  . Essential hypertension 01/30/2017  . Chronic pain due to injury 01/30/2017  . Gingivitis 01/30/2017  . OBESITY, UNSPECIFIED 12/26/2009  . Coronary atherosclerosis 12/26/2009  . GERD 12/26/2009  . Systemic lupus erythematosus (HCC) 12/26/2009  . 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 OBSTRUCTIVE SLEEP APNEA 12/01/2009    Outpatient Encounter Prescriptions as of 04/11/2017  Medication Sig  . aspirin EC 81 MG tablet Take 81 mg by mouth daily.  . carvedilol (COREG) 3.125 MG tablet Take 1 tablet (3.125 mg total) by mouth 2 (two) times daily with a meal.  . cephALEXin (KEFLEX) 500 MG capsule Take 2 capsules (1,000 mg total) by mouth 2 (two) times daily.  . ondansetron (ZOFRAN-ODT) 4 MG disintegrating tablet Take by mouth.  . oxybutynin (DITROPAN-XL) 10 MG 24 hr tablet Take 20 mg by mouth 2 (two) times daily.   [name]. [START ON 05/20/2017] oxyCODONE 30 MG 12 hr tablet Take by mouth.  . RABEprazole (ACIPHEX) 20 MG tablet Take by mouth.  . rosuvastatin (CRESTOR) 10 MG tablet Take 1 tablet (10 mg total) by mouth daily.  Marland Kitchen. topiramate (TOPAMAX) 50 MG tablet Take 50 mg by mouth 2 (two) times daily.  . Vitamin D,  Ergocalciferol, (DRISDOL) 50000 units CAPS capsule Take 100,000 Units by mouth every 7 (seven) days.  . naproxen (NAPROSYN) 500 MG tablet Take 1 tablet (500 mg total) by mouth 2 (two) times daily with a meal.  . nitroGLYCERIN (NITROSTAT) 0.4 MG SL tablet Place 1 tablet (0.4 mg total) under the tongue every 5 (five) minutes as needed for chest pain.   No facility-administered encounter medications on file as of 04/11/2017.     Allergies  Allergen Reactions  . Sulfa Antibiotics Hives    Rash and itching   Review of Systems  Constitutional: Negative for fatigue and unexpected weight change.  HENT: Positive for dental problem. Negative for mouth sores and rhinorrhea.   Eyes: Negative for photophobia and visual disturbance.  Respiratory: Negative for shortness of breath and wheezing.   Cardiovascular: Positive for palpitations and leg swelling. Negative for chest pain.  Gastrointestinal: Negative for constipation, nausea and vomiting.  Genitourinary: Negative for difficulty urinating and vaginal bleeding.  Musculoskeletal: Negative for arthralgias and back pain.  Neurological: Negative for dizziness and headaches.  Psychiatric/Behavioral: Positive for decreased concentration, dysphoric mood and sleep disturbance. The patient is nervous/anxious.        Disabled daughter at home    BP 138/70 (BP Location: Right Arm, Patient Position: Sitting, Cuff Size: Normal)   Pulse 62   Temp (!) 97 F (36.1 C) (Temporal)   Resp 16  Ht 5\' 6"  (1.676 m)   Wt 195 lb 0.6 oz (88.5 kg)   SpO2 98%   BMI 31.48 kg/m   Physical Exam  Constitutional: She is oriented to person, place, and time. She appears well-developed and well-nourished.  Gravelly voice  HENT:  Head: Normocephalic and atraumatic.  Mouth/Throat: Oropharynx is clear and moist.  Cracks at corner of mouth, teeth need repair, caries  Eyes: Conjunctivae are normal. Pupils are equal, round, and reactive to light.  Neck: Normal range of  motion. Neck supple.  Cardiovascular: Normal rate, regular rhythm and normal heart sounds.   Well-healed sternotomy scar. Occasional ectopy  Pulmonary/Chest: Effort normal and breath sounds normal. No respiratory distress.  Musculoskeletal: Normal range of motion. She exhibits no edema.  Left ankle with scarring and swelling  Lymphadenopathy:    She has no cervical adenopathy.  Neurological: She is alert and oriented to person, place, and time.  Gait normal  Skin: Skin is warm and dry.  Pale  Psychiatric: Her behavior is normal. Thought content normal.  Labile  Nursing note and vitals reviewed.   ASSESSMENT/PLAN:  1. Atherosclerosis of native coronary artery of native heart without angina pectoris  - Ambulatory referral to Cardiology  2. Gastroesophageal reflux disease, esophagitis presence not specified   3. Hyperlipidemia, unspecified hyperlipidemia type  - Lipid panel  4. Sleep disorder untreated sleep apnea with daytime fatigue.  Also stress and insomnia  5. Vitamin D deficiency/ overtreated - VITAMIN D 25 Hydroxy (Vit-D Deficiency, Fractures)  6. Systemic lupus erythematosus, unspecified SLE type, unspecified organ involvement status (HCC) By history - no symptoms   Patient Instructions  Your last cardiology referral was cancelled because you did not return calls/letters.  It is important that you make an appointment this time. Need to re check labs end of this month to follow the cholesterol and the vitamin D I have refilled the naprosyn for as needed use I recommend you take it with food  Continue under care counselor   See me in 3 months     Eustace Moore, MD

## 2017-04-11 NOTE — Patient Instructions (Addendum)
Your last cardiology referral was cancelled because you did not return calls/letters.  It is important that you make an appointment this time. Need to re check labs end of this month to follow the cholesterol and the vitamin D I have refilled the naprosyn for as needed use I recommend you take it with food  Continue under care counselor   See me in 3 months

## 2017-04-16 ENCOUNTER — Other Ambulatory Visit: Payer: Self-pay | Admitting: Family Medicine

## 2017-04-17 NOTE — Telephone Encounter (Signed)
Fill one more time until can see dentist

## 2017-04-17 NOTE — Telephone Encounter (Signed)
Patient informed of message below, verbalized understanding. She states she has called case worker 8 times and was told to call and find dentist- is unable to find dentist that takes her insurance and is taking new patients. Can't afford to pay out of pocket.
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
## 2017-05-10 ENCOUNTER — Emergency Department (HOSPITAL_COMMUNITY): Payer: Medicaid Other

## 2017-05-10 ENCOUNTER — Emergency Department (HOSPITAL_COMMUNITY)
Admission: EM | Admit: 2017-05-10 | Discharge: 2017-05-10 | Disposition: A | Discharge status: Home Health | Payer: Medicaid Other | Attending: Emergency Medicine | Admitting: Emergency Medicine

## 2017-05-10 ENCOUNTER — Encounter (HOSPITAL_COMMUNITY): Payer: Self-pay

## 2017-05-10 DIAGNOSIS — Y999 Unspecified external cause status: Secondary | ICD-10-CM | POA: Insufficient documentation

## 2017-05-10 DIAGNOSIS — I509 Heart failure, unspecified: Secondary | ICD-10-CM | POA: Diagnosis not present

## 2017-05-10 DIAGNOSIS — S199XXA Unspecified injury of neck, initial encounter: Secondary | ICD-10-CM | POA: Diagnosis present

## 2017-05-10 DIAGNOSIS — Y92410 Unspecified street and highway as the place of occurrence of the external cause: Secondary | ICD-10-CM | POA: Diagnosis not present

## 2017-05-10 DIAGNOSIS — M546 Pain in thoracic spine: Secondary | ICD-10-CM | POA: Insufficient documentation

## 2017-05-10 DIAGNOSIS — Z951 Presence of aortocoronary bypass graft: Secondary | ICD-10-CM | POA: Insufficient documentation

## 2017-05-10 DIAGNOSIS — G8929 Other chronic pain: Secondary | ICD-10-CM

## 2017-05-10 DIAGNOSIS — I2510 Atherosclerotic heart disease of native coronary artery without angina pectoris: Secondary | ICD-10-CM | POA: Diagnosis not present

## 2017-05-10 DIAGNOSIS — Z79899 Other long term (current) drug therapy: Secondary | ICD-10-CM | POA: Diagnosis not present

## 2017-05-10 DIAGNOSIS — J45909 Unspecified asthma, uncomplicated: Secondary | ICD-10-CM | POA: Diagnosis not present

## 2017-05-10 DIAGNOSIS — I110 Hypertensive heart disease with heart failure: Secondary | ICD-10-CM | POA: Diagnosis not present

## 2017-05-10 DIAGNOSIS — Z7982 Long term (current) use of aspirin: Secondary | ICD-10-CM | POA: Insufficient documentation

## 2017-05-10 DIAGNOSIS — Y9389 Activity, other specified: Secondary | ICD-10-CM | POA: Insufficient documentation

## 2017-05-10 DIAGNOSIS — S161XXA Strain of muscle, fascia and tendon at neck level, initial encounter: Secondary | ICD-10-CM | POA: Diagnosis not present
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
## 2017-05-10 NOTE — Discharge Instructions (Signed)
Continue your current medications. Can supplement with Naprosyn as needed. If not improving over the next few days follow-up with neurology. Office information and phone number of Dr. Gerilyn Pilgrimoonquah
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
## 2017-05-12 ENCOUNTER — Ambulatory Visit (INDEPENDENT_AMBULATORY_CARE_PROVIDER_SITE_OTHER): Payer: Medicaid Other | Admitting: Cardiovascular Disease

## 2017-05-12 ENCOUNTER — Encounter: Payer: Self-pay | Admitting: Cardiovascular Disease

## 2017-05-12 ENCOUNTER — Telehealth: Payer: Self-pay

## 2017-05-12 ENCOUNTER — Encounter: Payer: Self-pay | Admitting: *Deleted

## 2017-05-12 VITALS — BP 116/76 | HR 60 | Ht 66.0 in | Wt 193.4 lb

## 2017-05-12 DIAGNOSIS — I10 Essential (primary) hypertension: Secondary | ICD-10-CM

## 2017-05-12 DIAGNOSIS — K029 Dental caries, unspecified: Secondary | ICD-10-CM | POA: Diagnosis not present

## 2017-05-12 DIAGNOSIS — R0600 Dyspnea, unspecified: Secondary | ICD-10-CM | POA: Diagnosis not present

## 2017-05-12 DIAGNOSIS — R0789 Other chest pain: Secondary | ICD-10-CM | POA: Diagnosis not present

## 2017-05-12 NOTE — Patient Instructions (Addendum)
Medication Instructions:   Your physician recommends that you continue on your current medications as directed. Please refer to the Current Medication list given to you today.  Labwork:  NONE  Testing/Procedures: Your physician has requested that you have an echocardiogram. Echocardiography is a painless test that uses sound waves to create images of your heart. It provides your doctor with information about the size and shape of your heart and how well your heart's chambers and valves are working. This procedure takes approximately one hour. There are no restrictions for this procedure. Your physician has requested that you have a lexiscan myoview. For further information please visit [email or web address]. Please follow instruction sheet, as given.   Follow-Up:  Your physician recommends that you schedule a follow-up appointment in: 1 year. You will receive a reminder letter in the mail in about 10 months reminding you to call and schedule your appointment. If you don't receive this letter, please contact our office.  Any Other Special Instructions Will Be Listed Below (If Applicable).  You have been referred to the hospital dentist (Dr. Kristin BruinsKulinski)  If you need a refill on your cardiac medications before your next appointment, please call your pharmacy.

## 2017-05-12 NOTE — Telephone Encounter (Signed)
Talked with pt and she said [facility] said this claim was not filled.  Ratell said she will send to [facility] to refile with [facility]. Pt will follow back up in 2 weeks.

## 2017-05-16 ENCOUNTER — Other Ambulatory Visit (HOSPITAL_COMMUNITY): Payer: Medicaid Other

## 2017-05-16 ENCOUNTER — Encounter (HOSPITAL_COMMUNITY): Payer: Medicaid Other

## 2017-05-23 ENCOUNTER — Ambulatory Visit (HOSPITAL_COMMUNITY): Payer: Medicaid Other

## 2017-05-27 ENCOUNTER — Encounter (HOSPITAL_COMMUNITY): Payer: Medicaid Other

## 2017-05-27 ENCOUNTER — Ambulatory Visit (HOSPITAL_COMMUNITY): Payer: Medicaid Other

## 2017-06-13 ENCOUNTER — Other Ambulatory Visit (HOSPITAL_COMMUNITY): Payer: Self-pay | Admitting: Anesthesiology

## 2017-06-13 DIAGNOSIS — M542 Cervicalgia: Secondary | ICD-10-CM

## 2017-06-16 ENCOUNTER — Ambulatory Visit (HOSPITAL_COMMUNITY)
Admission: RE | Admit: 2017-06-16 | Discharge: 2017-06-16 | Disposition: A | Discharge status: Home / Self Care | Payer: Medicaid Other | Source: Ambulatory Visit | Attending: Cardiovascular Disease | Admitting: Cardiovascular Disease

## 2017-06-16 DIAGNOSIS — I10 Essential (primary) hypertension: Secondary | ICD-10-CM | POA: Insufficient documentation

## 2017-06-16 DIAGNOSIS — G4733 Obstructive sleep apnea (adult) (pediatric): Secondary | ICD-10-CM | POA: Diagnosis not present

## 2017-06-16 DIAGNOSIS — R0600 Dyspnea, unspecified: Secondary | ICD-10-CM | POA: Diagnosis not present

## 2017-06-16 DIAGNOSIS — E785 Hyperlipidemia, unspecified: Secondary | ICD-10-CM | POA: Insufficient documentation

## 2017-06-16 DIAGNOSIS — I340 Nonrheumatic mitral (valve) insufficiency: Secondary | ICD-10-CM | POA: Diagnosis not present

## 2017-06-16 DIAGNOSIS — Z951 Presence of aortocoronary bypass graft: Secondary | ICD-10-CM | POA: Insufficient documentation

## 2017-06-16 DIAGNOSIS — E669 Obesity, unspecified: Secondary | ICD-10-CM | POA: Diagnosis not present

## 2017-06-16 DIAGNOSIS — K219 Gastro-esophageal reflux disease without esophagitis: Secondary | ICD-10-CM | POA: Insufficient documentation

## 2017-06-16 LAB — ECHOCARDIOGRAM COMPLETE
CHL CUP DOP CALC LVOT VTI: 26.9 cm
E decel time: 271 msec
EERAT: 9.82
FS: 24 % — AB (ref 28–44)
IVS/LV PW RATIO, ED: 0.81
LA diam index: 1.71 cm/m2
LA vol A4C: 96.8 ml
LASIZE: 35 mm
LAVOL: 87.4 mL
LAVOLIN: 42.6 mL/m2
LDCA: 3.14 cm2
LEFT ATRIUM END SYS DIAM: 35 mm
LV E/e'average: 9.82
LV TDI E'LATERAL: 8.38
LV dias vol: 139 mL — AB (ref 46–106)
LV sys vol index: 40 mL/m2
LV sys vol: 83 mL — AB
LVDIAVOLIN: 68 mL/m2
LVEEMED: 9.82
LVELAT: 8.38 cm/s
LVOT diameter: 20 mm
LVOT peak grad rest: 6 mmHg
LVOT peak vel: 119 cm/s
LVOTSV: 84 mL
MV Dec: 271
MV pk A vel: 119 m/s
MV pk E vel: 82.3 m/s
MVPG: 3 mmHg
PW: 12.1 mm — AB (ref 0.6–1.1)
RV LATERAL S' VELOCITY: 9.25 cm/s
Simpson's disk: 41
Stroke v: 56 ml
TAPSE: 19.1 mm
TDI e' medial: 6.85
VTI: 233 cm

## 2017-06-16 NOTE — Progress Notes (Signed)
*  PRELIMINARY RESULTS* Echocardiogram 2D Echocardiogram has been performed.  Stacey DrainWhite, Tyriana Helmkamp J 06/16/2017, 12:27 PM

## 2017-06-18 ENCOUNTER — Other Ambulatory Visit: Payer: Self-pay | Admitting: Family Medicine

## 2017-06-18 ENCOUNTER — Inpatient Hospital Stay (HOSPITAL_COMMUNITY): Admission: RE | Admit: 2017-06-18 | Payer: Medicaid Other | Source: Ambulatory Visit

## 2017-06-18 ENCOUNTER — Ambulatory Visit (HOSPITAL_COMMUNITY): Payer: Medicaid Other

## 2017-06-18 NOTE — Telephone Encounter (Signed)
Do you want to order this

## 2017-06-19 ENCOUNTER — Telehealth: Payer: Self-pay | Admitting: *Deleted

## 2017-06-19 MED ORDER — LOSARTAN POTASSIUM 25 MG PO TABS: 25.0000 mg | ORAL_TABLET | Freq: Every day | ORAL | 11 refills | Status: AC

## 2017-06-19 NOTE — Telephone Encounter (Signed)
Order complete, pt notified

## 2017-06-19 NOTE — Telephone Encounter (Signed)
error 

## 2017-06-19 NOTE — Telephone Encounter (Signed)
Called patient with test results. No answer. Left message to call back.  

## 2017-06-23 ENCOUNTER — Telehealth: Payer: Self-pay | Admitting: Family Medicine

## 2017-06-23 NOTE — Telephone Encounter (Signed)
Patient left a voicemail on Friday, she was angry because she said her medication for thrush was declined. She wants someone to call her back and explain why [phone]#:[phone]

## 2017-06-24 NOTE — Telephone Encounter (Signed)
[name] is not usually an ongoing  disease - she needs to be seen if it recurs

## 2017-06-24 NOTE — Telephone Encounter (Signed)
lmtcb

## 2017-06-25 ENCOUNTER — Encounter: Payer: Self-pay | Admitting: Family Medicine

## 2017-06-25 NOTE — Telephone Encounter (Signed)
Called and left second msg for pt.

## 2017-06-27 ENCOUNTER — Inpatient Hospital Stay (HOSPITAL_COMMUNITY): Admission: RE | Admit: 2017-06-27 | Payer: Medicaid Other | Source: Ambulatory Visit

## 2017-06-27 ENCOUNTER — Ambulatory Visit (HOSPITAL_COMMUNITY): Payer: Medicaid Other

## 2017-07-04 ENCOUNTER — Encounter: Payer: Self-pay | Admitting: Family Medicine

## 2017-07-04 ENCOUNTER — Ambulatory Visit (INDEPENDENT_AMBULATORY_CARE_PROVIDER_SITE_OTHER): Payer: Medicaid Other | Admitting: Family Medicine

## 2017-07-04 ENCOUNTER — Ambulatory Visit (HOSPITAL_COMMUNITY)
Admission: RE | Admit: 2017-07-04 | Discharge: 2017-07-04 | Disposition: A | Discharge status: Home / Self Care | Payer: Medicaid Other | Source: Ambulatory Visit | Attending: Anesthesiology | Admitting: Anesthesiology

## 2017-07-04 VITALS — BP 142/84 | HR 65 | Temp 98.6°F | Resp 16 | Ht 66.0 in | Wt 192.0 lb

## 2017-07-04 DIAGNOSIS — I10 Essential (primary) hypertension: Secondary | ICD-10-CM

## 2017-07-04 DIAGNOSIS — G8929 Other chronic pain: Secondary | ICD-10-CM | POA: Diagnosis present

## 2017-07-04 DIAGNOSIS — G8921 Chronic pain due to trauma: Secondary | ICD-10-CM

## 2017-07-04 DIAGNOSIS — M4802 Spinal stenosis, cervical region: Secondary | ICD-10-CM | POA: Insufficient documentation

## 2017-07-04 DIAGNOSIS — T452X1A Poisoning by vitamins, accidental (unintentional), initial encounter: Secondary | ICD-10-CM

## 2017-07-04 DIAGNOSIS — N3281 Overactive bladder: Secondary | ICD-10-CM

## 2017-07-04 DIAGNOSIS — Z1159 Encounter for screening for other viral diseases: Secondary | ICD-10-CM

## 2017-07-04 DIAGNOSIS — M542 Cervicalgia: Secondary | ICD-10-CM

## 2017-07-04 DIAGNOSIS — Z23 Encounter for immunization: Secondary | ICD-10-CM | POA: Diagnosis not present

## 2017-07-04 DIAGNOSIS — E7801 Familial hypercholesterolemia: Secondary | ICD-10-CM | POA: Diagnosis not present

## 2017-07-04 MED ORDER — ALBUTEROL SULFATE (2.5 MG/3ML) 0.083% IN NEBU: 2.5000 mg | INHALATION_SOLUTION | Freq: Four times a day (QID) | RESPIRATORY_TRACT | 1 refills | Status: AC | PRN

## 2017-07-04 MED ORDER — ALBUTEROL SULFATE HFA 108 (90 BASE) MCG/ACT IN AERS: 2.0000 | INHALATION_SPRAY | Freq: Four times a day (QID) | RESPIRATORY_TRACT | 2 refills | Status: AC | PRN

## 2017-07-04 MED ORDER — OXYBUTYNIN CHLORIDE ER 10 MG PO TB24: 20.0000 mg | ORAL_TABLET | Freq: Two times a day (BID) | ORAL | 3 refills | Status: DC
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
## 2017-07-04 NOTE — Patient Instructions (Addendum)
Medicine refilled You are due for lab tests I will send you a letter with your test results.  If there is anything of concern, we will call right away.  See me every 3 months Use proventil as needed for wheezing

## 2017-07-05 LAB — COMPLETE METABOLIC PANEL WITH GFR
AG Ratio: 1.9 (calc) (ref 1.0–2.5)
ALBUMIN MSPROF: 4.3 g/dL (ref 3.6–5.1)
ALKALINE PHOSPHATASE (APISO): 66 U/L (ref 33–130)
ALT: 13 U/L (ref 6–29)
AST: 18 U/L (ref 10–35)
BUN: 9 mg/dL (ref 7–25)
CO2: 30 mmol/L (ref 20–32)
CREATININE: 0.65 mg/dL (ref 0.50–1.05)
Calcium: 9 mg/dL (ref 8.6–10.4)
Chloride: 103 mmol/L (ref 98–110)
GFR, Est African American: 116 mL/min/{1.73_m2} (ref >=60)
GFR, Est Non African American: 100 mL/min/{1.73_m2} (ref >=60)
GLUCOSE: 94 mg/dL (ref 65–139)
Globulin: 2.3 g/dL (calc) (ref 1.9–3.7)
Potassium: 4.1 mmol/L (ref 3.5–5.3)
Sodium: 139 mmol/L (ref 135–146)
TOTAL PROTEIN: 6.6 g/dL (ref 6.1–8.1)
Total Bilirubin: 0.4 mg/dL (ref 0.2–1.2)

## 2017-07-05 LAB — VITAMIN D 25 HYDROXY (VIT D DEFICIENCY, FRACTURES): VIT D 25 HYDROXY: 85 ng/mL (ref 30–100)

## 2017-07-05 LAB — LIPID PANEL
Cholesterol: 198 mg/dL (ref <200)
HDL: 42 mg/dL — ABNORMAL LOW (ref >50)
LDL CHOLESTEROL (CALC): 131 mg/dL — AB
Non-HDL Cholesterol (Calc): 156 mg/dL (calc) — ABNORMAL HIGH (ref <130)
Total CHOL/HDL Ratio: 4.7 (calc) (ref <5.0)
Triglycerides: 137 mg/dL (ref <150)

## 2017-07-05 LAB — HEPATITIS C ANTIBODY
Hepatitis C Ab: NONREACTIVE
SIGNAL TO CUT-OFF: 0.02 (ref <1.00)

## 2017-09-16 ENCOUNTER — Other Ambulatory Visit: Payer: Self-pay | Admitting: Family Medicine

## 2017-09-16 NOTE — Telephone Encounter (Signed)
Seen [phone] 

## 2017-10-03 ENCOUNTER — Telehealth: Payer: Self-pay | Admitting: Family Medicine

## 2017-10-03 ENCOUNTER — Other Ambulatory Visit: Payer: Self-pay | Admitting: Family Medicine

## 2017-10-03 ENCOUNTER — Ambulatory Visit: Payer: Medicaid Other | Admitting: Family Medicine

## 2017-10-03 MED ORDER — RABEPRAZOLE SODIUM 20 MG PO TBEC: 20.0000 mg | DELAYED_RELEASE_TABLET | Freq: Two times a day (BID) | ORAL | 5 refills | Status: AC

## 2017-10-03 NOTE — Telephone Encounter (Signed)
Patient called in to request the status of a preauth for her aciphex rx. Selena is resubmitting this request.

## 2017-10-03 NOTE — Telephone Encounter (Signed)
Seen [phone] 

## 2017-10-08 ENCOUNTER — Telehealth: Payer: Self-pay | Admitting: Family Medicine

## 2017-10-08 NOTE — Telephone Encounter (Signed)
Pt is calling in UPSET that she has not received a call back and has not received her medicine for Acid Reflux---She CANT take the Generic....please call her [phone]

## 2017-10-09 ENCOUNTER — Other Ambulatory Visit: Payer: Self-pay | Admitting: Family Medicine

## 2017-10-09 NOTE — Telephone Encounter (Signed)
Called the pharmacy, the medication has been approved, left message for Maiko,

## 2017-10-13 ENCOUNTER — Other Ambulatory Visit: Payer: Self-pay

## 2017-10-13 ENCOUNTER — Encounter: Payer: Self-pay | Admitting: Physical Therapy

## 2017-10-13 ENCOUNTER — Ambulatory Visit: Payer: Medicaid Other | Attending: Anesthesiology | Admitting: Physical Therapy

## 2017-10-13 DIAGNOSIS — M542 Cervicalgia: Secondary | ICD-10-CM | POA: Insufficient documentation

## 2017-10-13 NOTE — Patient Instructions (Signed)
[facility]  DRY NEEDLING CONSENT FORM   Trigger point dry needling is a physical therapy approach to treat Myofascial Pain and Dysfunction.  Dry Needling (DN) is a valuable and effective way to deactivate myofascial trigger points (muscle knots). It is skilled intervention that uses a thin filiform needle to penetrate the skin and stimulate underlying myofascial trigger points, muscular, and connective tissues for the management of neuromusculoskeletal pain and movement impairments.  A local twitch response (LTR) will be elicited.  This can sometimes feel like a deep ache in the muscle during the procedure. Multiple trigger points in multiple muscles can be treated during each treatment.  No medication of any kind is injected.   As with any medical treatment and procedure, there are possible adverse events.  While significant adverse events are uncommon, they do sometimes occur and must be considered prior to giving consent.  1. Dry needling often causes a "post needling soreness".  There can be an increase in pain from a couple of hours to 2-3 days, followed by an improvement in the overall pain state. 2. Any time a needle is used there is a risk of infection.  However, we are using new, sterile, and disposable needles; infections are extremely rare. 3. There is a possibility that you may bleed or bruise.  You may feel tired and some nausea following treatment. 4. There is a rare possibility of a pneumothorax (air in the chest cavity). 5. Allergic reaction to nickel in the stainless steel needle. 6. If a nerve is touched, it may cause paresthesia (a prickling/shock sensation) which is usually brief, but may continue for a couple of days.  Following treatment stay hydrated.  Continue regular activities but not too vigorous initially after treatment for 24-48 hours.  Dry Needling is best when combined with other physical therapy interventions such as strengthening,  stretching and other therapeutic modalities.     PLEASE ANSWER THE FOLLOWING QUESTIONS:  Do you have a lack of sensation?   Y/N  Do you have a phobia or fear of needles  Y/N  Are you pregnant?    Y/N If yes:  How many weeks? _____  Do you have any implanted devices?  Y/N If yes:  Pacemaker/Spinal Cord         Stimulator/Deep Brain         Stimulator/Insulin          Pump/Other: ____________ Do you have any implants?   Y/N If yes:      Do you take any blood thinners?   Y/N If yes: Coumadin          (Warfarin)/Other:  Do you have a bleeding disorder?   Y/N If yes: What kind:   Do you take any immunosuppressants?  Y/N If yes:   What kind:   Do you take anti-inflammatories?   Y/N If yes: What kind:  Have you ever been diagnosed with Scoliosis? Y/N  Have you had back surgery?    Y/N If yes:         Laminectomy/Fusion/Other:   I have read, or had read to me, the above.  I have had the opportunity to ask any questions.  All of my questions have been answered to my satisfaction and I understand the risks involved with dry needling.  [facility] and [facility], including dry needling, of any and all of my involved and affected muscles.   Solon PalmJulie Velinda Wrobel, PT 10/13/17  1:49 PM [facility] [address] [place], [place], [address] Phone: [phone]   Fax:  [phone]
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
## 2017-10-16 ENCOUNTER — Ambulatory Visit: Payer: Medicaid Other | Admitting: Family Medicine

## 2017-11-05 ENCOUNTER — Telehealth: Payer: Self-pay

## 2017-11-05 NOTE — Telephone Encounter (Signed)
[facility] and she gave me the # of the person she called in billing that she said told her the 2 $3.00 payments were credited to the wrong DOS.  I talked with Marcelino DusterMichelle in Billing at [phone] and she checked account # [phone] [id number] and does not see any payment for the 2 DOS in question.  I [facility] back [facility] and asked her to send a copy of the receipts she says she has to the address on the billing she received.  She was so upset saying that this is hurting her credit.  I even offered to send her $6.00 since she was so upset.  I asked her to work with us and the billing department will correct this if they find a mistake.  She said she is going to send them copies of the receipt.

## 2017-11-05 NOTE — Telephone Encounter (Signed)
Pt called yesterday about a copayment - I called her back and LMOM. To call us back

## 2017-11-14 ENCOUNTER — Other Ambulatory Visit: Payer: Self-pay | Admitting: Family Medicine

## 2017-11-17 NOTE — Telephone Encounter (Signed)
Seen [phone] 

## 2017-12-11 ENCOUNTER — Encounter: Payer: Self-pay | Admitting: Family Medicine

## 2017-12-11 ENCOUNTER — Ambulatory Visit: Payer: Medicaid Other | Admitting: Family Medicine

## 2017-12-11 VITALS — BP 128/80 | HR 92 | Temp 98.6°F | Ht 66.0 in | Wt 187.2 lb

## 2017-12-11 DIAGNOSIS — G8921 Chronic pain due to trauma: Secondary | ICD-10-CM

## 2017-12-11 DIAGNOSIS — K0510 Chronic gingivitis, plaque induced: Secondary | ICD-10-CM

## 2017-12-11 DIAGNOSIS — F4323 Adjustment disorder with mixed anxiety and depressed mood: Secondary | ICD-10-CM

## 2017-12-11 DIAGNOSIS — K029 Dental caries, unspecified: Secondary | ICD-10-CM

## 2017-12-11 DIAGNOSIS — E7801 Familial hypercholesterolemia: Secondary | ICD-10-CM | POA: Diagnosis not present

## 2017-12-11 DIAGNOSIS — E78019 Familial hypercholesterolemia, unspecified: Secondary | ICD-10-CM

## 2017-12-11 DIAGNOSIS — I2510 Atherosclerotic heart disease of native coronary artery without angina pectoris: Secondary | ICD-10-CM | POA: Diagnosis not present

## 2017-12-11 LAB — COMPLETE METABOLIC PANEL WITH GFR
AG Ratio: 1.2 (calc) (ref 1.0–2.5)
ALT: 8 U/L (ref 6–29)
AST: 11 U/L (ref 10–35)
Albumin: 3.8 g/dL (ref 3.6–5.1)
Alkaline phosphatase (APISO): 65 U/L (ref 33–130)
BUN: 10 mg/dL (ref 7–25)
CO2: 29 mmol/L (ref 20–32)
CREATININE: 0.7 mg/dL (ref 0.50–1.05)
Calcium: 9.1 mg/dL (ref 8.6–10.4)
Chloride: 104 mmol/L (ref 98–110)
GFR, EST NON AFRICAN AMERICAN: 97 mL/min/{1.73_m2} (ref >=60)
GFR, Est African American: 112 mL/min/{1.73_m2} (ref >=60)
GLOBULIN: 3.1 g/dL (ref 1.9–3.7)
Glucose, Bld: 101 mg/dL (ref 65–139)
Potassium: 4.6 mmol/L (ref 3.5–5.3)
SODIUM: 141 mmol/L (ref 135–146)
Total Bilirubin: 0.3 mg/dL (ref 0.2–1.2)
Total Protein: 6.9 g/dL (ref 6.1–8.1)

## 2017-12-11 LAB — LIPID PANEL
CHOL/HDL RATIO: 6.6 (calc) — AB (ref <5.0)
Cholesterol: 177 mg/dL (ref <200)
HDL: 27 mg/dL — ABNORMAL LOW (ref >50)
LDL Cholesterol (Calc): 115 mg/dL (calc) — ABNORMAL HIGH
NON-HDL CHOLESTEROL (CALC): 150 mg/dL — AB (ref <130)
Triglycerides: 228 mg/dL — ABNORMAL HIGH (ref <150)

## 2017-12-11 MED ORDER — FLUOXETINE HCL 20 MG PO TABS: 20.0000 mg | ORAL_TABLET | Freq: Every day | ORAL | 3 refills | Status: DC

## 2017-12-11 MED ORDER — OXYBUTYNIN CHLORIDE ER 10 MG PO TB24: 20.0000 mg | ORAL_TABLET | Freq: Two times a day (BID) | ORAL | 1 refills | Status: AC

## 2017-12-11 MED ORDER — SODIUM FLUORIDE 1.1 % DT CREA: 1.0000 "application " | TOPICAL_CREAM | Freq: Every evening | DENTAL | 11 refills | Status: AC

## 2017-12-11 NOTE — Patient Instructions (Addendum)
Need dental evaluation and treatment I have placed a referral Try to stay active  You are due for lab testing I have checked your medicines for refills Take the fluoxetine once a day in the morning See me in one month Call sooner for side effects

## 2017-12-11 NOTE — Progress Notes (Signed)
Chief Complaint  Patient presents with  . Follow-up    refill medications    Patient is here for a routine follow-up.  Referred to her recent consultation with cardiology. In his note Dr. Eden EmmsNishan recommends dental evaluation.  Referral is placed.  She has terrible periodontal disease and fractures which will increase her risks of heart disease. He also commented on how depressed she seemed.  I discussed this with her in the past.  Today she agrees to try medication.  She states she has been on a number of medicines in the past.  She cannot member the names of any of them.  She thinks that she did not like Cymbalta.  She thinks she did like BuSpar.  She is unfamiliar with fluoxetine.  Since she has such profound fatigue, I will try fluoxetine since it does have some energizing qualities.  She knows to take it every day.  Side effects discussed.  She will come back to see me in a month. She is very inactive due to chronic pain.  She sees a pain specialist and takes daily narcotics. Her blood pressure is well controlled. Her hyperlipidemia was not on the last blood work.  She agrees to get repeat blood work today.  Dr. Eden EmmsNishan put her on Crestor.  Hopefully it will give her some benefit.  We talked about the fact that the statin medications help to prevent heart attack and stroke.   Patient Active Problem List   Diagnosis Date Noted  . Food allergy 01/30/2017  . HLD (hyperlipidemia) 01/30/2017  . History of hepatitis 01/30/2017  . Essential hypertension 01/30/2017  . Chronic pain due to injury 01/30/2017  . Gingivitis 01/30/2017  . OBESITY, UNSPECIFIED 12/26/2009  . Coronary atherosclerosis 12/26/2009  . GERD 12/26/2009  . Systemic lupus erythematosus (HCC) 12/26/2009  . 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 OBSTRUCTIVE SLEEP APNEA 12/01/2009    Outpatient Encounter Medications as of 12/11/2017  Medication Sig  . albuterol (PROVENTIL HFA;VENTOLIN HFA) 108 (90 Base)  MCG/ACT inhaler Inhale 2 puffs into the lungs every 6 (six) hours as needed for wheezing or shortness of breath.  Marland Kitchen. albuterol (PROVENTIL) (2.5 MG/3ML) 0.083% nebulizer solution Take 3 mLs (2.5 mg total) by nebulization every 6 (six) hours as needed for wheezing or shortness of breath.  Marland Kitchen. aspirin EC 81 MG tablet Take 81 mg by mouth daily.  . carvedilol (COREG) 3.125 MG tablet Take 1 tablet (3.125 mg total) by mouth 2 (two) times daily with a meal.  . losartan (COZAAR) 25 MG tablet Take 1 tablet (25 mg total) by mouth daily.  . naproxen (NAPROSYN) 500 MG tablet Take 1 tablet (500 mg total) by mouth 2 (two) times daily with a meal.  . nitroGLYCERIN (NITROSTAT) 0.4 MG SL tablet Place 1 tablet (0.4 mg total) under the tongue every 5 (five) minutes as needed for chest pain.  Marland Kitchen. oxybutynin (DITROPAN-XL) 10 MG 24 hr tablet Take 2 tablets (20 mg total) by mouth 2 (two) times daily.  [name]. [START ON 12/14/2017] oxyCODONE 30 MG 12 hr tablet Take by mouth.  . RABEprazole (ACIPHEX) 20 MG tablet Take 1 tablet (20 mg total) by mouth 2 (two) times daily.  . rosuvastatin (CRESTOR) 10 MG tablet Take 1 tablet (10 mg total) by mouth daily.  . [DISCONTINUED] oxybutynin (DITROPAN-XL) 10 MG 24 hr tablet TAKE TWO TABLETS BY MOUTH TWICE DAILY  . [DISCONTINUED] Vitamin D, Ergocalciferol, (DRISDOL) 50000 units CAPS capsule TAKE 1 CAPSULE TWICE A WEEK  .  FLUoxetine (PROZAC) 20 MG tablet Take 1 tablet (20 mg total) by mouth daily.  . ondansetron (ZOFRAN-ODT) 4 MG disintegrating tablet Take by mouth.  . sodium fluoride (PREVIDENT 5000 PLUS) 1.1 % CREA dental cream Place 1 application onto teeth every evening.  . topiramate (TOPAMAX) 50 MG tablet Take 50 mg by mouth 2 (two) times daily.   No facility-administered encounter medications on file as of 12/11/2017.     Allergies  Allergen Reactions  . Coconut Oil Itching, Nausea And Vomiting and Swelling  . Sulfa Antibiotics Hives    Rash and itching    Review of Systems    Constitutional: Negative for fatigue and unexpected weight change.  HENT: Positive for dental problem. Negative for mouth sores and rhinorrhea.        Chronic  Eyes: Negative for photophobia and visual disturbance.  Respiratory: Negative for shortness of breath and wheezing.   Cardiovascular: Negative for chest pain, palpitations and leg swelling.  Gastrointestinal: Negative for constipation, nausea and vomiting.  Genitourinary: Negative for difficulty urinating and vaginal bleeding.  Musculoskeletal: Positive for back pain, neck pain and neck stiffness. Negative for arthralgias.  Neurological: Positive for numbness. Negative for dizziness and headaches.  Psychiatric/Behavioral: Positive for decreased concentration, dysphoric mood and sleep disturbance. The patient is nervous/anxious.        Disabled daughter at home- stressed     BP 128/80 (BP Location: Left Arm, Patient Position: Sitting, Cuff Size: Normal)   Pulse 92   Temp 98.6 F (37 C) (Temporal)   Ht 5\' 6"  (1.676 m)   Wt 187 lb 4 oz (84.9 kg)   SpO2 97%   BMI 30.22 kg/m   Physical Exam  Constitutional: She is oriented to person, place, and time. She appears well-developed and well-nourished.  Gravelly voice  HENT:  Head: Normocephalic and atraumatic.  Mouth/Throat: Oropharynx is clear and moist.  Cracks at corner of mouth, teeth need repair, caries, fractures, moderate periodontal disease  Eyes: Conjunctivae are normal. Pupils are equal, round, and reactive to light.  Neck: Normal range of motion. Neck supple.  Cardiovascular: Normal rate, regular rhythm and normal heart sounds.  Well-healed sternotomy scar. Occasional ectopy  Pulmonary/Chest: Effort normal and breath sounds normal. No respiratory distress.  Musculoskeletal: Normal range of motion. She exhibits no edema.  No edema  Lymphadenopathy:    She has no cervical adenopathy.  Neurological: She is alert and oriented to person, place, and time.  Slow and  guarded movements  Skin: Skin is warm and dry.  Pale  Psychiatric: Her behavior is normal. Thought content normal.  Appears frustrated, exhausted  Nursing note and vitals reviewed.   ASSESSMENT/PLAN:  1. Familial hypercholesterolemia Recently started on Crestor.  Due for lab work.  Discussed diet and exercise.  2. Chronic pain due to injury Under the care of pain clinic.  Inactive.  3. Gingivitis At risk for increased heart disease. - Ambulatory referral to Dentistry  4. Active dental caries See above - Ambulatory referral to Dentistry  5. Atherosclerosis of native coronary artery of native heart without angina pectoris Under care of cardiology. - Ambulatory referral to Dentistry - COMPLETE METABOLIC PANEL WITH GFR - Lipid panel   Patient Instructions  Need dental evaluation and treatment I have placed a referral Try to stay active  You are due for lab testing I have checked your medicines for refills Take the fluoxetine once a day in the morning See me in one month Call sooner for side effects  Raylene Everts, MD

## 2017-12-15 ENCOUNTER — Encounter: Payer: Self-pay | Admitting: Family Medicine

## 2017-12-16 ENCOUNTER — Other Ambulatory Visit: Payer: Self-pay | Admitting: Family Medicine

## 2017-12-16 MED ORDER — CEPHALEXIN 500 MG PO CAPS: 1000.0000 mg | ORAL_CAPSULE | Freq: Three times a day (TID) | ORAL | 0 refills | Status: DC

## 2017-12-17 ENCOUNTER — Telehealth: Payer: Self-pay | Admitting: Family Medicine

## 2017-12-17 NOTE — Telephone Encounter (Signed)
Patient left message on nurse line stating that she was started on Fluoxetine on Thursday and since then, she has had 'massive bile heartburn.' She states that she has been taking the medication with food, she has tried taking with water, and taking with milk, but she is still having the heartburn. She states this is weird because she is on a medication for heartburn. She states she spoke to the pharmacist who advised her to call her doctor because this is not a typical reaction to this medication. She states the heartburn starts 10-15 minutes after taking the medication and lasts 1-2 hours.  Callback# [phone]

## 2017-12-19 MED ORDER — VENLAFAXINE HCL ER 75 MG PO CP24: 75.0000 mg | ORAL_CAPSULE | Freq: Every day | ORAL | 1 refills | Status: AC

## 2017-12-19 NOTE — Addendum Note (Signed)
Addended by: Mack HookJOHNSON, Bryton Waight on: 12/19/2017 02:55 PM   Modules accepted: Orders

## 2017-12-19 NOTE — Telephone Encounter (Signed)
Done. Patient informed of message below, verbalized understanding.  

## 2017-12-19 NOTE — Telephone Encounter (Signed)
She can stop the fluoxetine.  Call in for her Effexor X are 75 mg a day to take instead.  Can switch from one directly to the other.  Give her 30 pills with 1 refill please

## 2017-12-29 ENCOUNTER — Encounter: Payer: Self-pay | Admitting: Family Medicine

## 2017-12-30 ENCOUNTER — Other Ambulatory Visit: Payer: Self-pay | Admitting: Family Medicine

## 2018-01-08 ENCOUNTER — Encounter: Payer: Self-pay | Admitting: Family Medicine

## 2018-01-08 ENCOUNTER — Other Ambulatory Visit: Payer: Self-pay | Admitting: Family Medicine

## 2018-01-13 ENCOUNTER — Ambulatory Visit: Payer: Medicaid Other | Admitting: Family Medicine

## 2018-03-10 ENCOUNTER — Other Ambulatory Visit (HOSPITAL_COMMUNITY): Payer: Self-pay | Admitting: Anesthesiology

## 2018-03-10 DIAGNOSIS — M5412 Radiculopathy, cervical region: Secondary | ICD-10-CM

## 2018-03-18 ENCOUNTER — Ambulatory Visit (HOSPITAL_COMMUNITY): Payer: Medicaid Other

## 2018-03-19 ENCOUNTER — Ambulatory Visit (HOSPITAL_COMMUNITY)
Admission: RE | Admit: 2018-03-19 | Discharge: 2018-03-19 | Disposition: A | Discharge status: Home / Self Care | Payer: Medicaid Other | Source: Ambulatory Visit | Attending: Anesthesiology | Admitting: Anesthesiology

## 2018-03-19 DIAGNOSIS — M5412 Radiculopathy, cervical region: Secondary | ICD-10-CM

## 2018-03-24 ENCOUNTER — Ambulatory Visit (HOSPITAL_COMMUNITY): Payer: Medicaid Other

## 2018-03-26 ENCOUNTER — Encounter (HOSPITAL_COMMUNITY): Payer: Self-pay

## 2018-03-26 ENCOUNTER — Ambulatory Visit (HOSPITAL_COMMUNITY): Admission: RE | Admit: 2018-03-26 | Payer: Medicaid Other | Source: Ambulatory Visit

## 2018-06-12 ENCOUNTER — Other Ambulatory Visit: Payer: Self-pay | Admitting: Pain Medicine

## 2018-06-12 DIAGNOSIS — M5412 Radiculopathy, cervical region: Secondary | ICD-10-CM

## 2018-06-26 ENCOUNTER — Other Ambulatory Visit (HOSPITAL_COMMUNITY): Payer: Self-pay | Admitting: Pain Medicine

## 2018-06-26 DIAGNOSIS — M5412 Radiculopathy, cervical region: Secondary | ICD-10-CM

## 2018-07-03 ENCOUNTER — Ambulatory Visit (HOSPITAL_COMMUNITY)
Admission: RE | Admit: 2018-07-03 | Discharge: 2018-07-03 | Disposition: A | Discharge status: Home / Self Care | Payer: Medicaid Other | Source: Ambulatory Visit | Attending: Pain Medicine | Admitting: Pain Medicine

## 2018-07-03 DIAGNOSIS — M2578 Osteophyte, vertebrae: Secondary | ICD-10-CM | POA: Diagnosis not present

## 2018-07-03 DIAGNOSIS — M4802 Spinal stenosis, cervical region: Secondary | ICD-10-CM | POA: Diagnosis not present

## 2018-07-03 DIAGNOSIS — M5412 Radiculopathy, cervical region: Secondary | ICD-10-CM | POA: Insufficient documentation

## 2019-04-25 ENCOUNTER — Encounter (HOSPITAL_COMMUNITY): Payer: Self-pay | Admitting: General Practice

## 2019-05-08 NOTE — Death Summary Note (Signed)
Pt received to [place] from [place], [place].  Pt pronounced 2019/05/10 04-Feb-2135 by Konrad Dolores ME.  Pt placed in morgue 04/30/2019 0135.  EMS gives bottle of Xtampxa ER 27 mg cap fill date 11/2018 with 6 white oval tablet marked L484 inside.  EMS states these were not found until after the body was sealed by ME.  L484 = Acetaminophen 500 mg.  Tab placed in security bag and sealed with Colonel Bald RN verifying content.  Tabs placed in morgue with body.   Pt placement called with report of pt previously called by ME, informed pt arrived and placed in morgue cooler 04/28/2019 0135.    [facility] called with ID [id number] - Bethena Roys CDS rep.  Pt placed on hold by CDS.

## 2019-05-08 DEATH — deceased
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
